# Patient Record
Sex: Female | Born: 1980 | Race: Black or African American | Hispanic: No | Marital: Single | Smoking: Never smoker
Health system: Southern US, Community
[De-identification: ages and names within clinical notes are randomized; demographics above are authoritative.]

## PROBLEM LIST (undated history)

## (undated) HISTORY — PX: APPENDECTOMY: SHX54

## (undated) HISTORY — PX: CHOLECYSTECTOMY: SHX55

---

## 2019-06-25 ENCOUNTER — Other Ambulatory Visit: Payer: Self-pay

## 2019-06-25 ENCOUNTER — Emergency Department (HOSPITAL_COMMUNITY)
Admission: EM | Admit: 2019-06-25 | Discharge: 2019-06-26 | Disposition: A | Discharge status: Home / Self Care | Payer: Medicaid Other | Attending: Emergency Medicine | Admitting: Emergency Medicine

## 2019-06-25 ENCOUNTER — Encounter (HOSPITAL_COMMUNITY): Payer: Self-pay | Admitting: Family Medicine

## 2019-06-25 DIAGNOSIS — O26891 Other specified pregnancy related conditions, first trimester: Secondary | ICD-10-CM | POA: Diagnosis present

## 2019-06-25 DIAGNOSIS — Z3A01 Less than 8 weeks gestation of pregnancy: Secondary | ICD-10-CM | POA: Insufficient documentation

## 2019-06-25 DIAGNOSIS — O2341 Unspecified infection of urinary tract in pregnancy, first trimester: Secondary | ICD-10-CM | POA: Diagnosis not present

## 2019-06-25 DIAGNOSIS — Z79899 Other long term (current) drug therapy: Secondary | ICD-10-CM | POA: Insufficient documentation

## 2019-06-25 LAB — CBC WITH DIFFERENTIAL/PLATELET
Abs Immature Granulocytes: 0.02 10*3/uL (ref 0.00–0.07)
Basophils Absolute: 0 10*3/uL (ref 0.0–0.1)
Basophils Relative: 0 %
Eosinophils Absolute: 0.1 10*3/uL (ref 0.0–0.5)
Eosinophils Relative: 1 %
HCT: 40.4 % (ref 36.0–46.0)
Hemoglobin: 13.6 g/dL (ref 12.0–15.0)
Immature Granulocytes: 0 %
Lymphocytes Relative: 35 %
Lymphs Abs: 3.2 10*3/uL (ref 0.7–4.0)
MCH: 32.6 pg (ref 26.0–34.0)
MCHC: 33.7 g/dL (ref 30.0–36.0)
MCV: 96.9 fL (ref 80.0–100.0)
Monocytes Absolute: 0.6 10*3/uL (ref 0.1–1.0)
Monocytes Relative: 7 %
Neutro Abs: 5.3 10*3/uL (ref 1.7–7.7)
Neutrophils Relative %: 57 %
Platelets: 250 10*3/uL (ref 150–400)
RBC: 4.17 MIL/uL (ref 3.87–5.11)
RDW: 12.1 % (ref 11.5–15.5)
WBC: 9.3 10*3/uL (ref 4.0–10.5)
nRBC: 0 % (ref 0.0–0.2)

## 2019-06-25 LAB — URINALYSIS, ROUTINE W REFLEX MICROSCOPIC
Bacteria, UA: NONE SEEN
Bilirubin Urine: NEGATIVE
Glucose, UA: NEGATIVE mg/dL
Hgb urine dipstick: NEGATIVE
Ketones, ur: 5 mg/dL — AB
Nitrite: NEGATIVE
Protein, ur: NEGATIVE mg/dL
Specific Gravity, Urine: 1.021 (ref 1.005–1.030)
pH: 5 (ref 5.0–8.0)

## 2019-06-25 LAB — BASIC METABOLIC PANEL
Anion gap: 9 (ref 5–15)
BUN: 11 mg/dL (ref 6–20)
CO2: 24 mmol/L (ref 22–32)
Calcium: 9.5 mg/dL (ref 8.9–10.3)
Chloride: 102 mmol/L (ref 98–111)
Creatinine, Ser: 0.82 mg/dL (ref 0.44–1.00)
GFR calc Af Amer: >60 mL/min (ref >60)
GFR calc non Af Amer: >60 mL/min (ref >60)
Glucose, Bld: 97 mg/dL (ref 70–99)
Potassium: 3.3 mmol/L — ABNORMAL LOW (ref 3.5–5.1)
Sodium: 135 mmol/L (ref 135–145)

## 2019-06-25 LAB — PREGNANCY, URINE: Preg Test, Ur: POSITIVE — AB

## 2019-06-25 NOTE — ED Triage Notes (Signed)
Patient is complaining of nausea, loss of appetite, body fatigue, shoulder/neck pain, and dizzy. Symptoms started 2 days ago. Denies taking any medications or therapy for symptoms. Patient is ambulatory to triage room.

## 2019-06-25 NOTE — ED Provider Notes (Signed)
[facility]-EMERGENCY DEPT Provider Note   CSN: [id number] Arrival date & time: 06/25/19  2026     History   Chief Complaint Chief Complaint  Patient presents with   Multiple Complaints    HPI Samantha Leon is a 38 y.o. female.     38 y/o [id number] female presenting for multiple complaints. Notes onset of nausea and anorexia 2 days ago. This has been fairly constant and unchanged. She has also been experiencing generalized fatigue. Reports sporadic discomfort in her lower abdomen with "spotting". LMP was 2.5 weeks ago. Denies use of any OTC medications for symptom relief. Denies sick contacts, fever, vomiting, diarrhea, melena, hematochezia, dysuria, cough, congestion, rhinorrhea.  Alludes to the fact that she was hospitalized 2 years ago for an infection of her Mirena IUD.  States that she had similar symptoms during this admission and also reports that her "thyroid was off".  Was not subsequently put on any thyroid medication.  She is no longer on any form of birth control.  Recently relocated to [facility] from [place], [place].  Does not have a local PCP.     History reviewed. No pertinent past medical history.  There are no active problems to display for this patient.   History reviewed. No pertinent surgical history.   OB History   No obstetric history on file.      Home Medications    Prior to Admission medications   Medication Sig Start Date End Date Taking? Authorizing Provider  calcium carbonate (OS-CAL - DOSED IN MG OF ELEMENTAL CALCIUM) 1250 (500 Ca) MG tablet Take 1 tablet by mouth daily.   Yes [provider]  magnesium 30 MG tablet Take 30 mg by mouth daily.   Yes [provider]  Methylsulfonylmethane (MSM PO) Take 1 tablet by mouth daily.   Yes [provider]  cephALEXin (KEFLEX) 500 MG capsule Take 1 capsule (500 mg total) by mouth 2 (two) times daily. 06/26/19   Antony Madura, PA-C    Family History History  reviewed. No pertinent family history.  Social History Social History   Tobacco Use   Smoking status: Never Smoker   Smokeless tobacco: Never Used  Substance Use Topics   Alcohol use: Yes    Comment: 1 times a week    Drug use: Never     Allergies   Strawberry (diagnostic), Tomato, and Codeine   Review of Systems Review of Systems Ten systems reviewed and are negative for acute change, except as noted in the HPI.    Physical Exam Updated Vital Signs BP 106/71    Pulse 66    Temp 98.3 F (36.8 C) (Oral)    Resp 16    Ht 5\' 6"  (1.676 m)    Wt 83.5 kg    SpO2 100%    BMI 29.70 kg/m   Physical Exam Vitals signs and nursing note reviewed.  Constitutional:      General: She is not in acute distress.    Appearance: She is well-developed. She is not diaphoretic.     Comments: Patient in NAD. Nontoxic appearing  HENT:     Head: Normocephalic and atraumatic.  Eyes:     General: No scleral icterus.    Conjunctiva/sclera: Conjunctivae normal.  Neck:     Musculoskeletal: Normal range of motion.  Cardiovascular:     Rate and Rhythm: Normal rate and regular rhythm.     Pulses: Normal pulses.  Pulmonary:     Effort: Pulmonary effort is  normal. No respiratory distress.     Comments: Respirations even and unlabored. Lungs CTAB. Abdominal:     General: There is no distension.     Comments: Abdomen soft with suprapubic TTP. Also mildly tender in bilateral lower quadrants. No peritoneal signs or palpable masses.  Musculoskeletal: Normal range of motion.  Skin:    General: Skin is warm and dry.     Coloration: Skin is not pale.     Findings: No erythema or rash.  Neurological:     General: No focal deficit present.     Mental Status: She is alert and oriented to person, place, and time.     Coordination: Coordination normal.     Comments: Ambulatory with steady gait.  Psychiatric:        Behavior: Behavior normal.      ED Treatments / Results  Labs (all labs  ordered are listed, but only abnormal results are displayed) Labs Reviewed  URINALYSIS, ROUTINE W REFLEX MICROSCOPIC - Abnormal; Notable for the following components:      Result Value   APPearance HAZY (*)    Ketones, ur 5 (*)    Leukocytes,Ua SMALL (*)    All other components within normal limits  PREGNANCY, URINE - Abnormal; Notable for the following components:   Preg Test, Ur POSITIVE (*)    All other components within normal limits  BASIC METABOLIC PANEL - Abnormal; Notable for the following components:   Potassium 3.3 (*)    All other components within normal limits  HCG, QUANTITATIVE, PREGNANCY - Abnormal; Notable for the following components:   hCG, Beta Chain, Quant, S 42,527 (*)    All other components within normal limits  URINE CULTURE  CBC WITH DIFFERENTIAL/PLATELET  TSH    EKG None  Radiology [place] Ob Comp Less 14 Wks  Result Date: 06/26/2019 CLINICAL DATA:  Initial evaluation for acute pelvic pain, early pregnancy. EXAM: OBSTETRIC <14 WK US AND TRANSVAGINAL OB US TECHNIQUE: Both transabdominal and transvaginal ultrasound examinations were performed for complete evaluation of the gestation as well as the maternal uterus, adnexal regions, and pelvic cul-de-sac. Transvaginal technique was performed to assess early pregnancy. COMPARISON:  None. FINDINGS: Intrauterine gestational sac: Single Yolk sac:  Present Embryo:  Present Cardiac Activity: Cardiac activity is visualized on cine imaging, although not able to be detected via M-mode due to small size. Heart Rate: N/A  bpm CRL: 3.5 mm   6 w   0 d                  US EDC: 03/14/2020 Subchorionic hemorrhage:  None visualized. Maternal uterus/adnexae: Ovaries are normal in appearance bilaterally. Small corpus luteal cyst noted on the right. No free fluid within the pelvis. IMPRESSION: 1. Single IUP with internal yolk sac and embryo, estimated gestational age [redacted] weeks and 0 days by crown-rump length, with ultrasound EDC of 03/14/2020.  Cardiac activity visualized on grayscale cine imaging, although not able to be measured due to small size of the embryo. No complication. 2. No other acute maternal uterine or adnexal abnormality identified. Electronically Signed   By: Rise MuBenjamin  McClintock M.D.   On: 06/26/2019 00:55   [place] Ob Transvaginal  Result Date: 06/26/2019 CLINICAL DATA:  Initial evaluation for acute pelvic pain, early pregnancy. EXAM: OBSTETRIC <14 WK US AND TRANSVAGINAL OB US TECHNIQUE: Both transabdominal and transvaginal ultrasound examinations were performed for complete evaluation of the gestation as well as the maternal uterus, adnexal regions, and pelvic cul-de-sac. Transvaginal technique was  performed to assess early pregnancy. COMPARISON:  None. FINDINGS: Intrauterine gestational sac: Single Yolk sac:  Present Embryo:  Present Cardiac Activity: Cardiac activity is visualized on cine imaging, although not able to be detected via M-mode due to small size. Heart Rate: N/A  bpm CRL: 3.5 mm   6 w   0 d                  [place] EDC: 03/14/2020 Subchorionic hemorrhage:  None visualized. Maternal uterus/adnexae: Ovaries are normal in appearance bilaterally. Small corpus luteal cyst noted on the right. No free fluid within the pelvis. IMPRESSION: 1. Single IUP with internal yolk sac and embryo, estimated gestational age [redacted] weeks and 0 days by crown-rump length, with ultrasound EDC of 03/14/2020. Cardiac activity visualized on grayscale cine imaging, although not able to be measured due to small size of the embryo. No complication. 2. No other acute maternal uterine or adnexal abnormality identified. Electronically Signed   By: Jeannine Boga M.D.   On: 06/26/2019 00:55    Procedures Procedures (including critical care time)  Medications Ordered in ED Medications - No data to display   11:56 PM Patient updated on positive pregnancy test. UA c/w likely UTI. Will add culture. Given lower abdominal pain will obtain ultrasound to  assess for ectopic, though pain may be attributed to UTI.   Initial Impression / Assessment and Plan / ED Course  I have reviewed the triage vital signs and the nursing notes.  Pertinent labs & imaging results that were available during my care of the patient were reviewed by me and considered in my medical decision making (see chart for details).        38 year old [id number] female presenting for lower abdominal pain with associated nausea and anorexia.  She has also been increasingly fatigued.  Her work-up today is significant for an intrauterine pregnancy of approximately 6 weeks.  This is complicated by pyuria consistent with UTI in first trimester of pregnancy.  Urine culture in process.  Remainder of labs today are reassuring.  We will plan for discharge on Keflex.  The patient has been given referral to [facility] for ongoing prenatal care.  Return precautions discussed and provided. Patient discharged in stable condition with no unaddressed concerns.   Final Clinical Impressions(s) / ED Diagnoses   Final diagnoses:  Urinary tract infection in mother during first trimester of pregnancy    ED Discharge Orders         Ordered    cephALEXin (KEFLEX) 500 MG capsule  2 times daily     06/26/19 0103           Antonietta Breach, PA-C 06/26/19 0459    Maudie Flakes, MD 06/27/19 [phone]

## 2019-06-25 NOTE — ED Notes (Signed)
Called down to lab to add on urine culture and hCG quantitative. Spoke with Ubaldo Glassing who advised she has added these on.

## 2019-06-26 ENCOUNTER — Emergency Department (HOSPITAL_COMMUNITY): Payer: Medicaid Other

## 2019-06-26 LAB — TSH: TSH: 0.967 u[IU]/mL (ref 0.350–4.500)

## 2019-06-26 LAB — HCG, QUANTITATIVE, PREGNANCY: hCG, Beta Chain, Quant, S: 42527 m[IU]/mL — ABNORMAL HIGH (ref <5)

## 2019-06-26 MED ORDER — CEPHALEXIN 500 MG PO CAPS: 500.0000 mg | ORAL_CAPSULE | Freq: Two times a day (BID) | ORAL | 0 refills | Status: AC

## 2019-06-26 NOTE — Discharge Instructions (Signed)
Take Keflex as prescribed for management of UTI.  We recommend follow-up with an OB/GYN for ongoing prenatal care and to ensure resolution of this urinary tract infection.  Use Unisom and B6 for management of nausea in pregnancy. Take a daily prenatal vitamin.  You may present to [facility] for any new or concerning symptoms.

## 2019-06-27 LAB — URINE CULTURE

## 2019-08-06 ENCOUNTER — Other Ambulatory Visit: Payer: Self-pay

## 2019-08-06 DIAGNOSIS — Z20822 Contact with and (suspected) exposure to covid-19: Secondary | ICD-10-CM

## 2019-08-07 LAB — NOVEL CORONAVIRUS, NAA: SARS-CoV-2, NAA: NOT DETECTED

## 2019-12-02 ENCOUNTER — Encounter: Payer: Self-pay | Admitting: Obstetrics and Gynecology

## 2019-12-02 ENCOUNTER — Other Ambulatory Visit (HOSPITAL_COMMUNITY)
Admission: RE | Admit: 2019-12-02 | Discharge: 2019-12-02 | Disposition: A | Discharge status: Home / Self Care | Payer: Medicaid Other | Source: Ambulatory Visit | Attending: Obstetrics and Gynecology | Admitting: Obstetrics and Gynecology

## 2019-12-02 ENCOUNTER — Ambulatory Visit (INDEPENDENT_AMBULATORY_CARE_PROVIDER_SITE_OTHER): Payer: Medicaid Other | Admitting: Obstetrics and Gynecology

## 2019-12-02 ENCOUNTER — Other Ambulatory Visit: Payer: Self-pay

## 2019-12-02 VITALS — BP 131/82 | HR 76 | Temp 98.7°F | Ht 66.0 in | Wt 190.7 lb

## 2019-12-02 DIAGNOSIS — Z01419 Encounter for gynecological examination (general) (routine) without abnormal findings: Secondary | ICD-10-CM | POA: Diagnosis not present

## 2019-12-02 DIAGNOSIS — Z0000 Encounter for general adult medical examination without abnormal findings: Secondary | ICD-10-CM

## 2019-12-02 MED ORDER — NORELGESTROMIN-ETH ESTRADIOL 150-35 MCG/24HR TD PTWK: 1.0000 | MEDICATED_PATCH | TRANSDERMAL | 12 refills | Status: AC

## 2019-12-02 NOTE — Progress Notes (Signed)
Subjective:     Samantha Leon is a 39 y.o. female P3 with BMI 30 who is here for a comprehensive physical exam. The patient reports no problems. She is sexually active using condoms and desires to start patch. She reports the presence of a white discharge with odor. She denies any pelvic pain. She reports a monthly period  History reviewed. No pertinent past medical history. Past Surgical History:  Procedure Laterality Date  . APPENDECTOMY    . CHOLECYSTECTOMY     History reviewed. No pertinent family history.   Social History   Socioeconomic History  . Marital status: Single    Spouse name: Not on file  . Number of children: Not on file  . Years of education: Not on file  . Highest education level: Not on file  Occupational History  . Not on file  Tobacco Use  . Smoking status: Never Smoker  . Smokeless tobacco: Never Used  Substance and Sexual Activity  . Alcohol use: Not Currently    Comment: 1 times a week   . Drug use: Never  . Sexual activity: Yes    Birth control/protection: None    Comment: Mirena-pelvic infection, pills-caused gallbladder removal; depo-cyst on ovaries  Other Topics Concern  . Not on file  Social History Narrative  . Not on file   Social Determinants of Health   Financial Resource Strain:   . Difficulty of Paying Living Expenses: Not on file  Food Insecurity:   . Worried About Charity fundraiser in the Last Year: Not on file  . Ran Out of Food in the Last Year: Not on file  Transportation Needs:   . Lack of Transportation (Medical): Not on file  . Lack of Transportation (Non-Medical): Not on file  Physical Activity:   . Days of Exercise per Week: Not on file  . Minutes of Exercise per Session: Not on file  Stress:   . Feeling of Stress : Not on file  Social Connections:   . Frequency of Communication with Friends and Family: Not on file  . Frequency of Social Gatherings with Friends and Family: Not on file  . Attends Religious Services:  Not on file  . Active Member of Clubs or Organizations: Not on file  . Attends Archivist Meetings: Not on file  . Marital Status: Not on file  Intimate Partner Violence:   . Fear of Current or Ex-Partner: Not on file  . Emotionally Abused: Not on file  . Physically Abused: Not on file  . Sexually Abused: Not on file   Health Maintenance  Topic Date Due  . HIV Screening  08/17/1997  . TETANUS/TDAP  08/17/2001  . PAP SMEAR-Modifier  08/18/2003  . INFLUENZA VACCINE  05/25/2019       Review of Systems Pertinent items are noted in HPI.   Objective:  Blood pressure 131/82, pulse 76, temperature 98.7 F (37.1 C), temperature source Oral, height 5\' 6"  (1.676 m), weight 190 lb 11.2 oz (86.5 kg).     GENERAL: Well-developed, well-nourished female in no acute distress.  HEENT: Normocephalic, atraumatic. Sclerae anicteric.  NECK: Supple. Normal thyroid.  LUNGS: Clear to auscultation bilaterally.  HEART: Regular rate and rhythm. BREASTS: Symmetric in size. No palpable masses or lymphadenopathy, skin changes, or nipple drainage. ABDOMEN: Soft, nontender, nondistended. No organomegaly. PELVIC: Normal external female genitalia. Vagina is pink and rugated.  Normal discharge. Normal appearing cervix. Uterus is normal in size.  No adnexal mass or tenderness. EXTREMITIES: No  cyanosis, clubbing, or edema, 2+ distal pulses.    Assessment:    Healthy female exam.      Plan:    pap smear collected STI screening per patient reguest Rx ortho evra provided Patient will be contacted with abnormal results See After Visit Summary for Counseling Recommendations

## 2019-12-03 LAB — HEPATITIS B SURFACE ANTIGEN: Hepatitis B Surface Ag: NEGATIVE

## 2019-12-03 LAB — CYTOLOGY - PAP
Comment: NEGATIVE
Diagnosis: NEGATIVE
High risk HPV: NEGATIVE

## 2019-12-03 LAB — CERVICOVAGINAL ANCILLARY ONLY
Bacterial Vaginitis (gardnerella): POSITIVE — AB
Candida Glabrata: NEGATIVE
Candida Vaginitis: NEGATIVE
Chlamydia: NEGATIVE
Comment: NEGATIVE
Comment: NEGATIVE
Comment: NEGATIVE
Comment: NEGATIVE
Comment: NEGATIVE
Comment: NORMAL
Neisseria Gonorrhea: NEGATIVE
Trichomonas: POSITIVE — AB

## 2019-12-03 LAB — HEPATITIS C ANTIBODY: Hep C Virus Ab: <0.1 s/co ratio (ref 0.0–0.9)

## 2019-12-03 LAB — TSH: TSH: 0.854 u[IU]/mL (ref 0.450–4.500)

## 2019-12-03 MED ORDER — METRONIDAZOLE 500 MG PO TABS: 500.0000 mg | ORAL_TABLET | Freq: Two times a day (BID) | ORAL | 0 refills | Status: DC

## 2019-12-03 NOTE — Addendum Note (Signed)
Addended by: Catalina Antigua on: 12/03/2019 02:38 PM   Modules accepted: Orders

## 2019-12-30 ENCOUNTER — Other Ambulatory Visit: Payer: Self-pay

## 2020-01-02 ENCOUNTER — Encounter: Payer: Self-pay | Admitting: Obstetrics and Gynecology

## 2020-01-02 ENCOUNTER — Ambulatory Visit: Payer: Medicaid Other | Admitting: Obstetrics and Gynecology

## 2020-01-02 ENCOUNTER — Other Ambulatory Visit: Payer: Self-pay

## 2020-01-02 ENCOUNTER — Other Ambulatory Visit (HOSPITAL_COMMUNITY)
Admission: RE | Admit: 2020-01-02 | Discharge: 2020-01-02 | Disposition: A | Discharge status: Home / Self Care | Payer: Medicaid Other | Source: Ambulatory Visit | Attending: Obstetrics and Gynecology | Admitting: Obstetrics and Gynecology

## 2020-01-02 VITALS — BP 132/84 | Wt 188.0 lb

## 2020-01-02 DIAGNOSIS — Z113 Encounter for screening for infections with a predominantly sexual mode of transmission: Secondary | ICD-10-CM

## 2020-01-02 NOTE — Progress Notes (Signed)
Pt tested positive for trichomonas on 12/02/19, treated 12/03/19. Pt reports that her partner just told her that he was never treated, and pt reports she was intimate with him again around 12/14/19. Pt would like to know if she needs to be tested again.

## 2020-01-02 NOTE — Progress Notes (Signed)
39 yo here for STI testing. She was recently treated for trichomonas but partner was never treated. They have since been intimate again without condoms. Patient denies any pelvic pain or abnormal discharge.  No past medical history on file. Past Surgical History:  Procedure Laterality Date  . APPENDECTOMY    . CHOLECYSTECTOMY     No family history on file. Social History   Tobacco Use  . Smoking status: Never Smoker  . Smokeless tobacco: Never Used  Substance Use Topics  . Alcohol use: Not Currently    Comment: 1 times a week   . Drug use: Never   ROS See pertinent in HPI. All other systems reviewed and negative Blood pressure 132/84, weight 188 lb (85.3 kg), last menstrual period 01/01/2020.  GENERAL: Well-developed, well-nourished female in no acute distress.  ABDOMEN: Soft, nontender, nondistended. No organomegaly. PELVIC: Normal external female genitalia. Vagina is pink and rugated.  Normal discharge.  EXTREMITIES: No cyanosis, clubbing, or edema, 2+ distal pulses.  A/P 39 yo here for STI testing  - vaginal swab collected - patient will be contacted with abnormal results - RTC prn

## 2020-01-02 NOTE — Patient Instructions (Signed)
Trichomoniasis Trichomoniasis is an STI (sexually transmitted infection) that can affect both women and men. In women, the outer area of the female genitalia (vulva) and the vagina are affected. In men, mainly the penis is affected, but the prostate and other reproductive organs can also be involved.  This condition can be treated with medicine. It often has no symptoms (is asymptomatic), especially in men. If not treated, trichomoniasis can last for months or years. What are the causes? This condition is caused by a parasite called Trichomonas vaginalis. Trichomoniasis most often spreads from person to person (is contagious) through sexual contact. What increases the risk? The following factors may make you more likely to develop this condition:  Having unprotected sex.  Having sex with a partner who has trichomoniasis.  Having multiple sexual partners.  Having had previous trichomoniasis infections or other STIs. What are the signs or symptoms? In women, symptoms of trichomoniasis include:  Abnormal vaginal discharge that is clear, white, gray, or yellow-green and foamy and has an unusual "fishy" odor.  Itching and irritation of the vagina and vulva.  Burning or pain during urination or sex.  Redness and swelling of the genitals. In men, symptoms of trichomoniasis include:  Penile discharge that may be foamy or contain pus.  Pain in the penis. This may happen only when urinating.  Itching or irritation inside the penis.  Burning after urination or ejaculation. How is this diagnosed? In women, this condition may be found during a routine Pap test or physical exam. It may be found in men during a routine physical exam. Your health care provider may do tests to help diagnose this infection, such as:  Urine tests (men and women).  The following in women: ? Testing the pH of the vagina. ? A vaginal swab test that checks for the Trichomonas vaginalis parasite. ? Testing vaginal  secretions. Your health care provider may test you for other STIs, including HIV (human immunodeficiency virus). How is this treated? This condition is treated with medicine taken by mouth (orally), such as metronidazole or tinidazole, to fight the infection. Your sexual partner(s) also need to be tested and treated.  If you are a woman and you plan to become pregnant or think you may be pregnant, tell your health care provider right away. Some medicines that are used to treat the infection should not be taken during pregnancy. Your health care provider may recommend over-the-counter medicines or creams to help relieve itching or irritation. You may be tested for infection again 3 months after treatment. Follow these instructions at home:  Take and use over-the-counter and prescription medicines, including creams, only as told by your health care provider.  Take your antibiotic medicine as told by your health care provider. Do not stop taking the antibiotic even if you start to feel better.  Do not have sex until 7-10 days after you finish your medicine, or until your health care provider approves. Ask your health care provider when you may start to have sex again.  (Women) Do not douche or wear tampons while you have the infection.  Discuss your infection with your sexual partner(s). Make sure that your partner gets tested and treated, if necessary.  Keep all follow-up visits as told by your health care provider. This is important. How is this prevented?   Use condoms every time you have sex. Using condoms correctly and consistently can help protect against STIs.  Avoid having multiple sexual partners.  Talk with your sexual partner about any   symptoms that either of you may have, as well as any history of STIs.  Get tested for STIs and STDs (sexually transmitted diseases) before you have sex. Ask your partner to do the same.  Do not have sexual contact if you have symptoms of  trichomoniasis or another STI. Contact a health care provider if:  You still have symptoms after you finish your medicine.  You develop pain in your abdomen.  You have pain when you urinate.  You have bleeding after sex.  You develop a rash.  You feel nauseous or you vomit.  You plan to become pregnant or think you may be pregnant. Summary  Trichomoniasis is an STI (sexually transmitted infection) that can affect both women and men.  This condition often has no symptoms (is asymptomatic), especially in men.  Without treatment, this condition can last for months or years.  You should not have sex until 7-10 days after you finish your medicine, or until your health care provider approves. Ask your health care provider when you may start to have sex again.  Discuss your infection with your sexual partner(s). Make sure that your partner gets tested and treated, if necessary. This information is not intended to replace advice given to you by your health care provider. Make sure you discuss any questions you have with your health care provider. Document Revised: 07/24/2018 Document Reviewed: 07/24/2018 Elsevier Patient Education  2020 [employer]  

## 2020-01-03 LAB — CERVICOVAGINAL ANCILLARY ONLY
Chlamydia: NEGATIVE
Comment: NEGATIVE
Comment: NEGATIVE
Comment: NORMAL
Neisseria Gonorrhea: NEGATIVE
Trichomonas: NEGATIVE

## 2020-01-30 ENCOUNTER — Other Ambulatory Visit: Payer: Self-pay | Admitting: Obstetrics and Gynecology

## 2020-01-30 MED ORDER — XULANE 150-35 MCG/24HR TD PTWK: 1.0000 | MEDICATED_PATCH | TRANSDERMAL | 12 refills | Status: AC

## 2020-02-22 ENCOUNTER — Encounter (HOSPITAL_COMMUNITY): Payer: Self-pay | Admitting: Emergency Medicine

## 2020-02-22 ENCOUNTER — Emergency Department (HOSPITAL_COMMUNITY)
Admission: EM | Admit: 2020-02-22 | Discharge: 2020-02-22 | Disposition: A | Discharge status: Home / Self Care | Payer: Medicaid Other | Attending: Emergency Medicine | Admitting: Emergency Medicine

## 2020-02-22 ENCOUNTER — Other Ambulatory Visit: Payer: Self-pay

## 2020-02-22 DIAGNOSIS — N760 Acute vaginitis: Secondary | ICD-10-CM | POA: Diagnosis not present

## 2020-02-22 DIAGNOSIS — B373 Candidiasis of vulva and vagina: Secondary | ICD-10-CM | POA: Diagnosis not present

## 2020-02-22 DIAGNOSIS — B3731 Acute candidiasis of vulva and vagina: Secondary | ICD-10-CM

## 2020-02-22 DIAGNOSIS — Z79899 Other long term (current) drug therapy: Secondary | ICD-10-CM | POA: Diagnosis not present

## 2020-02-22 DIAGNOSIS — B9689 Other specified bacterial agents as the cause of diseases classified elsewhere: Secondary | ICD-10-CM

## 2020-02-22 DIAGNOSIS — N898 Other specified noninflammatory disorders of vagina: Secondary | ICD-10-CM | POA: Diagnosis present

## 2020-02-22 LAB — CBC WITH DIFFERENTIAL/PLATELET
Abs Immature Granulocytes: 0 10*3/uL (ref 0.00–0.07)
Basophils Absolute: 0 10*3/uL (ref 0.0–0.1)
Basophils Relative: 0 %
Eosinophils Absolute: 0.1 10*3/uL (ref 0.0–0.5)
Eosinophils Relative: 1 %
HCT: 42.5 % (ref 36.0–46.0)
Hemoglobin: 14.5 g/dL (ref 12.0–15.0)
Immature Granulocytes: 0 %
Lymphocytes Relative: 31 %
Lymphs Abs: 2.1 10*3/uL (ref 0.7–4.0)
MCH: 33 pg (ref 26.0–34.0)
MCHC: 34.1 g/dL (ref 30.0–36.0)
MCV: 96.6 fL (ref 80.0–100.0)
Monocytes Absolute: 0.4 10*3/uL (ref 0.1–1.0)
Monocytes Relative: 7 %
Neutro Abs: 4 10*3/uL (ref 1.7–7.7)
Neutrophils Relative %: 61 %
Platelets: 238 10*3/uL (ref 150–400)
RBC: 4.4 MIL/uL (ref 3.87–5.11)
RDW: 11.8 % (ref 11.5–15.5)
WBC: 6.6 10*3/uL (ref 4.0–10.5)
nRBC: 0 % (ref 0.0–0.2)

## 2020-02-22 LAB — URINALYSIS, ROUTINE W REFLEX MICROSCOPIC
Bilirubin Urine: NEGATIVE
Glucose, UA: NEGATIVE mg/dL
Hgb urine dipstick: NEGATIVE
Ketones, ur: NEGATIVE mg/dL
Leukocytes,Ua: NEGATIVE
Nitrite: NEGATIVE
Protein, ur: NEGATIVE mg/dL
Specific Gravity, Urine: 1.021 (ref 1.005–1.030)
pH: 6 (ref 5.0–8.0)

## 2020-02-22 LAB — COMPREHENSIVE METABOLIC PANEL
ALT: 31 U/L (ref 0–44)
AST: 27 U/L (ref 15–41)
Albumin: 3.8 g/dL (ref 3.5–5.0)
Alkaline Phosphatase: 61 U/L (ref 38–126)
Anion gap: 7 (ref 5–15)
BUN: 11 mg/dL (ref 6–20)
CO2: 23 mmol/L (ref 22–32)
Calcium: 9.3 mg/dL (ref 8.9–10.3)
Chloride: 108 mmol/L (ref 98–111)
Creatinine, Ser: 0.7 mg/dL (ref 0.44–1.00)
GFR calc Af Amer: >60 mL/min (ref >60)
GFR calc non Af Amer: >60 mL/min (ref >60)
Glucose, Bld: 100 mg/dL — ABNORMAL HIGH (ref 70–99)
Potassium: 3.6 mmol/L (ref 3.5–5.1)
Sodium: 138 mmol/L (ref 135–145)
Total Bilirubin: 0.7 mg/dL (ref 0.3–1.2)
Total Protein: 7.6 g/dL (ref 6.5–8.1)

## 2020-02-22 LAB — LIPASE, BLOOD: Lipase: 31 U/L (ref 11–51)

## 2020-02-22 LAB — WET PREP, GENITAL
Sperm: NONE SEEN
Trich, Wet Prep: NONE SEEN

## 2020-02-22 MED ORDER — FLUCONAZOLE 150 MG PO TABS
150.0000 mg | ORAL_TABLET | Freq: Once | ORAL | Status: AC
  Administered 2020-02-22: 150 mg via ORAL
  Filled 2020-02-22: qty 1

## 2020-02-22 MED ORDER — SODIUM CHLORIDE 0.9 % IV BOLUS
1000.0000 mL | Freq: Once | INTRAVENOUS | Status: AC
  Administered 2020-02-22: 1000 mL via INTRAVENOUS

## 2020-02-22 MED ORDER — METRONIDAZOLE 500 MG PO TABS
500.0000 mg | ORAL_TABLET | Freq: Once | ORAL | Status: AC
  Administered 2020-02-22: 500 mg via ORAL
  Filled 2020-02-22: qty 1

## 2020-02-22 NOTE — ED Triage Notes (Signed)
Pt having abd pain and back pains for about week. Past 2 days has white-beige vaginal discharge.

## 2020-02-22 NOTE — ED Notes (Signed)
Urine culture sent down to lab with urinalysis. 

## 2020-02-22 NOTE — ED Provider Notes (Signed)
[facility]-EMERGENCY DEPT Provider Note   CSN: [id number] Arrival date & time: 02/22/20  0856     History Chief Complaint  Patient presents with  . Abdominal Pain  . Back Pain  . Vaginal Discharge    Dimitri Dsouza is a 39 y.o. female.  Pt presents to the ED today with abdominal pain and vaginal d/c.  Pt said she's had intermittent pain in her low back and in her low abdomen for about a week.  She's had d/c for about 2 days.  She is sexually active.  No condoms.  She is on a birth control patch.  LMP 4/18.  Pt denies any abnormal vaginal bleeding.  No f/c.  No n/v/d/constipation.  She made an appt with her doctor, but it's at the end of May.  She had a severe LLQ pain this am, so she came to the ED.        History reviewed. No pertinent past medical history.  There are no problems to display for this patient.   Past Surgical History:  Procedure Laterality Date  . APPENDECTOMY    . CHOLECYSTECTOMY       OB History    Gravida  6   Para  3   Term  3   Preterm      AB  2   Living  3     SAB  1   TAB      Ectopic      Multiple      Live Births  3           No family history on file.  Social History   Tobacco Use  . Smoking status: Never Smoker  . Smokeless tobacco: Never Used  Substance Use Topics  . Alcohol use: Not Currently    Comment: 1 times a week   . Drug use: Never    Home Medications Prior to Admission medications   Medication Sig Start Date End Date Taking? Authorizing Provider  Ascorbic Acid (VITAMIN C PO) Take 1 tablet by mouth daily.   Yes [provider]  ASHWAGANDHA PO Take 1 tablet by mouth daily.   Yes [provider]  BIOTIN PO Take 1 tablet by mouth daily.   Yes [provider]  Cyanocobalamin (B-12 PO) Take 1 tablet by mouth daily.   Yes [provider]  Ferrous Sulfate (IRON PO) Take 1 tablet by mouth daily.   Yes [provider]  Methylsulfonylmethane  (MSM PO) Take 1 tablet by mouth daily.   Yes [provider]  Moringa Oleifera (MORINGA PO) Take 1 tablet by mouth daily.   Yes [provider]  Multiple Vitamins-Minerals (ZINC PO) Take 1 tablet by mouth daily.   Yes [provider]  norelgestromin-ethinyl estradiol [facility]) 150-35 MCG/24HR transdermal patch Place 1 patch onto the skin once a week. 01/30/20  Yes Constant, Peggy, MD  Prenatal Vit-Fe Fumarate-FA (PRENATAL PO) Take 1 tablet by mouth daily.   Yes [provider]  SELENIUM PO Take 1 tablet by mouth daily.   Yes [provider]  sodium chloride (OCEAN) 0.65 % SOLN nasal spray Place 1 spray into both nostrils as needed for congestion.   Yes [provider]  cephALEXin (KEFLEX) 500 MG capsule Take 1 capsule (500 mg total) by mouth 2 (two) times daily. Patient not taking: Reported on 12/02/2019 06/26/19   Antony Madura, PA-C  metroNIDAZOLE (FLAGYL) 500 MG tablet Take 1 tablet (500 mg total) by  mouth 2 (two) times daily. Patient not taking: Reported on 01/02/2020 12/03/19   Constant, Peggy, MD  norelgestromin-ethinyl estradiol (ORTHO EVRA) 150-35 MCG/24HR transdermal patch Place 1 patch onto the skin once a week. Patient not taking: Reported on 02/22/2020 12/02/19   Constant, Peggy, MD    Allergies    Strawberry (diagnostic), Tomato, and Codeine  Review of Systems   Review of Systems  Gastrointestinal: Positive for abdominal pain.  Genitourinary: Positive for vaginal discharge.  All other systems reviewed and are negative.   Physical Exam Updated Vital Signs BP 121/81 (BP Location: Left Arm)   Pulse 65   Temp 98.2 F (36.8 C)   Resp 18   LMP 02/09/2020   SpO2 100%   Physical Exam Vitals and nursing note reviewed.  Constitutional:      Appearance: She is well-developed.  HENT:     Head: Normocephalic and atraumatic.     Mouth/Throat:     Mouth: Mucous membranes are moist.  Eyes:     Extraocular Movements: Extraocular movements  intact.     Pupils: Pupils are equal, round, and reactive to light.  Cardiovascular:     Rate and Rhythm: Normal rate and regular rhythm.  Pulmonary:     Effort: Pulmonary effort is normal.     Breath sounds: Normal breath sounds.  Abdominal:     General: Abdomen is flat. Bowel sounds are normal.     Palpations: Abdomen is soft.  Genitourinary:    Vagina: Normal.     Cervix: Discharge present.     Uterus: Normal.      Adnexa: Right adnexa normal and left adnexa normal.  Skin:    General: Skin is warm.     Capillary Refill: Capillary refill takes less than 2 seconds.  Neurological:     General: No focal deficit present.     Mental Status: She is alert and oriented to person, place, and time.  Psychiatric:        Mood and Affect: Mood normal.        Behavior: Behavior normal.     ED Results / Procedures / Treatments   Labs (all labs ordered are listed, but only abnormal results are displayed) Labs Reviewed  WET PREP, GENITAL - Abnormal; Notable for the following components:      Result Value   Yeast Wet Prep HPF POC PRESENT (*)    Clue Cells Wet Prep HPF POC PRESENT (*)    WBC, Wet Prep HPF POC MANY (*)    All other components within normal limits  COMPREHENSIVE METABOLIC PANEL - Abnormal; Notable for the following components:   Glucose, Bld 100 (*)    All other components within normal limits  CBC WITH DIFFERENTIAL/PLATELET  LIPASE, BLOOD  URINALYSIS, ROUTINE W REFLEX MICROSCOPIC  GC/CHLAMYDIA PROBE AMP ([place]) NOT AT [facility]    EKG None  Radiology No results found.  Procedures Procedures (including critical care time)  Medications Ordered in ED Medications  fluconazole (DIFLUCAN) tablet 150 mg (has no administration in time range)  metroNIDAZOLE (FLAGYL) tablet 500 mg (has no administration in time range)  sodium chloride 0.9 % bolus 1,000 mL (1,000 mLs Intravenous New Bag/Given 02/22/20 1610)    ED Course  I have reviewed the triage vital signs and the  nursing notes.  Pertinent labs & imaging results that were available during my care of the patient were reviewed by me and considered in my medical decision making (see chart for details).    MDM  Rules/Calculators/A&P                      Pt does have BV and will be treated with flagyl.  Pt also has candidiasis and will be treated with diflucan.   Pt is stable for d/c.  Return if worse.   Final Clinical Impression(s) / ED Diagnoses Final diagnoses:  BV (bacterial vaginosis)  Vaginal candidiasis    Rx / DC Orders ED Discharge Orders    None       Jacalyn Lefevre, MD 02/22/20 1115

## 2020-02-22 NOTE — ED Notes (Signed)
Patient aware that urine sample is needed.

## 2020-02-23 ENCOUNTER — Telehealth (HOSPITAL_COMMUNITY): Payer: Self-pay | Admitting: Emergency Medicine

## 2020-02-23 MED ORDER — METRONIDAZOLE 500 MG PO TABS: 500.0000 mg | ORAL_TABLET | Freq: Two times a day (BID) | ORAL | 0 refills | Status: AC

## 2020-02-23 NOTE — Telephone Encounter (Signed)
Patient went to pharmacy but no script there.  Looks like she should be on flagyl. Will prescribe.

## 2020-02-24 LAB — GC/CHLAMYDIA PROBE AMP (~~LOC~~) NOT AT ARMC
Chlamydia: NEGATIVE
Comment: NEGATIVE
Comment: NORMAL
Neisseria Gonorrhea: NEGATIVE

## 2020-03-24 ENCOUNTER — Emergency Department (HOSPITAL_COMMUNITY)
Admission: EM | Admit: 2020-03-24 | Discharge: 2020-03-25 | Disposition: A | Discharge status: Home / Self Care | Payer: Medicaid Other | Attending: Emergency Medicine | Admitting: Emergency Medicine

## 2020-03-24 ENCOUNTER — Other Ambulatory Visit: Payer: Self-pay

## 2020-03-24 ENCOUNTER — Encounter (HOSPITAL_COMMUNITY): Payer: Self-pay

## 2020-03-24 DIAGNOSIS — R112 Nausea with vomiting, unspecified: Secondary | ICD-10-CM | POA: Insufficient documentation

## 2020-03-24 DIAGNOSIS — R109 Unspecified abdominal pain: Secondary | ICD-10-CM | POA: Diagnosis not present

## 2020-03-24 DIAGNOSIS — R531 Weakness: Secondary | ICD-10-CM | POA: Insufficient documentation

## 2020-03-24 DIAGNOSIS — Z5321 Procedure and treatment not carried out due to patient leaving prior to being seen by health care provider: Secondary | ICD-10-CM | POA: Insufficient documentation

## 2020-03-24 MED ORDER — SODIUM CHLORIDE 0.9% FLUSH: 3.0000 mL | Freq: Once | INTRAVENOUS | Status: DC

## 2020-03-24 NOTE — ED Triage Notes (Signed)
Patient arrived stating she has been feeling weak with nausea, vomiting, and abdominal pain since Sunday. Also reporting sinus drainage.

## 2020-03-25 LAB — COMPREHENSIVE METABOLIC PANEL
ALT: 27 U/L (ref 0–44)
AST: 32 U/L (ref 15–41)
Albumin: 3.7 g/dL (ref 3.5–5.0)
Alkaline Phosphatase: 61 U/L (ref 38–126)
Anion gap: 9 (ref 5–15)
BUN: 8 mg/dL (ref 6–20)
CO2: 24 mmol/L (ref 22–32)
Calcium: 8.9 mg/dL (ref 8.9–10.3)
Chloride: 102 mmol/L (ref 98–111)
Creatinine, Ser: 0.63 mg/dL (ref 0.44–1.00)
GFR calc Af Amer: >60 mL/min (ref >60)
GFR calc non Af Amer: >60 mL/min (ref >60)
Glucose, Bld: 101 mg/dL — ABNORMAL HIGH (ref 70–99)
Potassium: 4.5 mmol/L (ref 3.5–5.1)
Sodium: 135 mmol/L (ref 135–145)
Total Bilirubin: 0.8 mg/dL (ref 0.3–1.2)
Total Protein: 6.9 g/dL (ref 6.5–8.1)

## 2020-03-25 LAB — CBC
HCT: 36.5 % (ref 36.0–46.0)
Hemoglobin: 12.6 g/dL (ref 12.0–15.0)
MCH: 33.2 pg (ref 26.0–34.0)
MCHC: 34.5 g/dL (ref 30.0–36.0)
MCV: 96.1 fL (ref 80.0–100.0)
Platelets: 252 10*3/uL (ref 150–400)
RBC: 3.8 MIL/uL — ABNORMAL LOW (ref 3.87–5.11)
RDW: 12.5 % (ref 11.5–15.5)
WBC: 9.7 10*3/uL (ref 4.0–10.5)
nRBC: 0 % (ref 0.0–0.2)

## 2020-03-25 LAB — LIPASE, BLOOD: Lipase: 38 U/L (ref 11–51)

## 2020-03-25 NOTE — ED Notes (Signed)
Not seen in lobby for recheck of V/S 

## 2020-04-07 DIAGNOSIS — Z3A09 9 weeks gestation of pregnancy: Secondary | ICD-10-CM | POA: Diagnosis not present

## 2020-04-07 DIAGNOSIS — Z91018 Allergy to other foods: Secondary | ICD-10-CM | POA: Diagnosis not present

## 2020-04-07 DIAGNOSIS — O26891 Other specified pregnancy related conditions, first trimester: Secondary | ICD-10-CM | POA: Diagnosis not present

## 2020-04-07 DIAGNOSIS — O039 Complete or unspecified spontaneous abortion without complication: Secondary | ICD-10-CM | POA: Diagnosis not present

## 2020-04-07 DIAGNOSIS — Z885 Allergy status to narcotic agent status: Secondary | ICD-10-CM | POA: Diagnosis not present

## 2020-04-07 DIAGNOSIS — R1030 Lower abdominal pain, unspecified: Secondary | ICD-10-CM | POA: Diagnosis not present

## 2020-04-08 DIAGNOSIS — Z3A09 9 weeks gestation of pregnancy: Secondary | ICD-10-CM | POA: Diagnosis not present

## 2020-04-08 DIAGNOSIS — O26891 Other specified pregnancy related conditions, first trimester: Secondary | ICD-10-CM | POA: Diagnosis not present

## 2020-04-21 DIAGNOSIS — O219 Vomiting of pregnancy, unspecified: Secondary | ICD-10-CM | POA: Diagnosis not present

## 2020-04-21 DIAGNOSIS — Z3201 Encounter for pregnancy test, result positive: Secondary | ICD-10-CM | POA: Diagnosis not present

## 2020-04-23 DIAGNOSIS — Z3201 Encounter for pregnancy test, result positive: Secondary | ICD-10-CM | POA: Diagnosis not present

## 2020-04-30 DIAGNOSIS — O021 Missed abortion: Secondary | ICD-10-CM | POA: Diagnosis not present

## 2020-05-01 DIAGNOSIS — O021 Missed abortion: Secondary | ICD-10-CM | POA: Diagnosis not present

## 2021-07-08 IMAGING — US US OB COMP LESS 14 WK
1 series · 13 of 28 positions shown · non-contrast
Comparison: None.

CLINICAL DATA: Initial evaluation for acute pelvic pain, early
pregnancy.

EXAM:
OBSTETRIC <14 WK US AND TRANSVAGINAL OB US
TECHNIQUE: Both transabdominal and transvaginal ultrasound examinations were
performed for complete evaluation of the gestation as well as the
maternal uterus, adnexal regions, and pelvic cul-de-sac.
Transvaginal technique was performed to assess early pregnancy.

[Series 1: us ob comp less 14 wk · 13 of 87 slices shown]
[im 4/87]
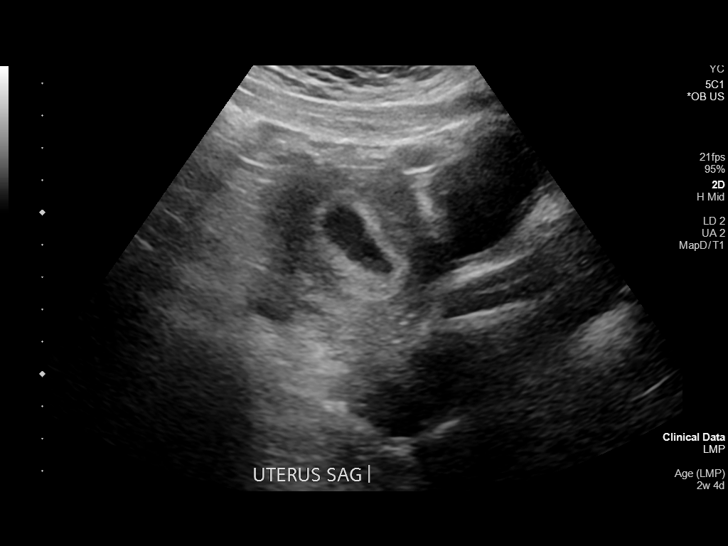
[im 10/87]
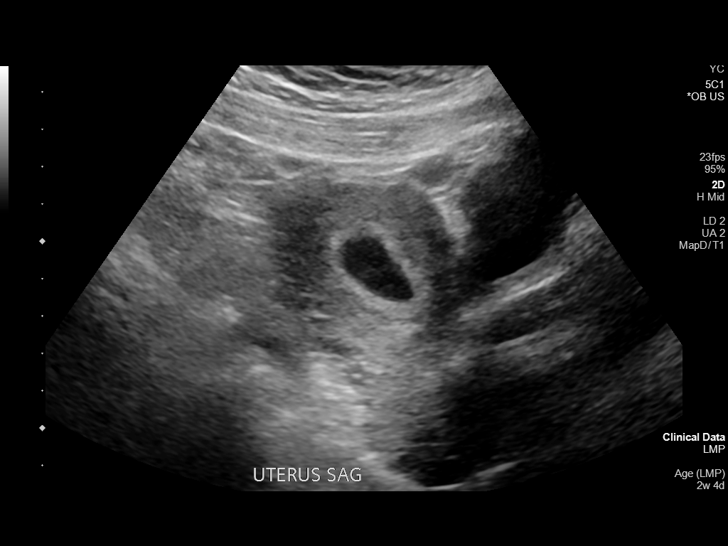
[im 16/87]
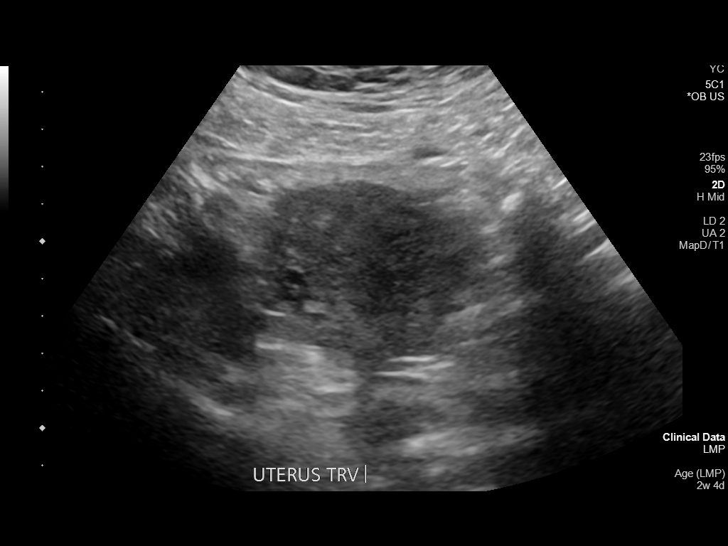
[im 23/87]
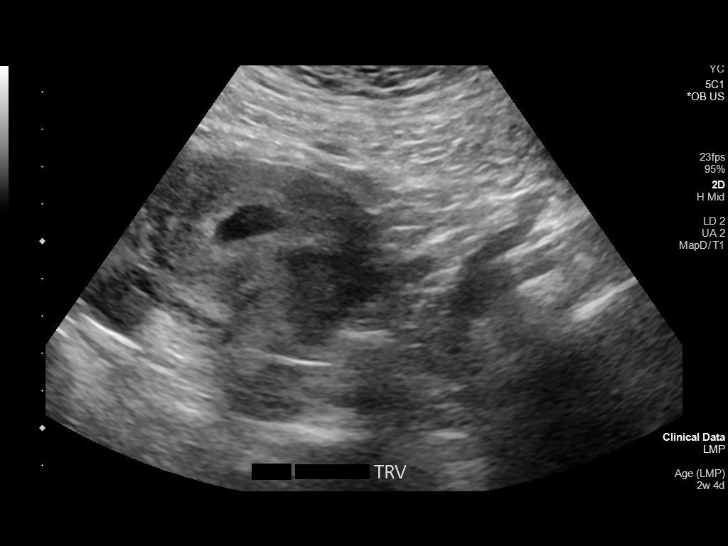
[im 29/87]
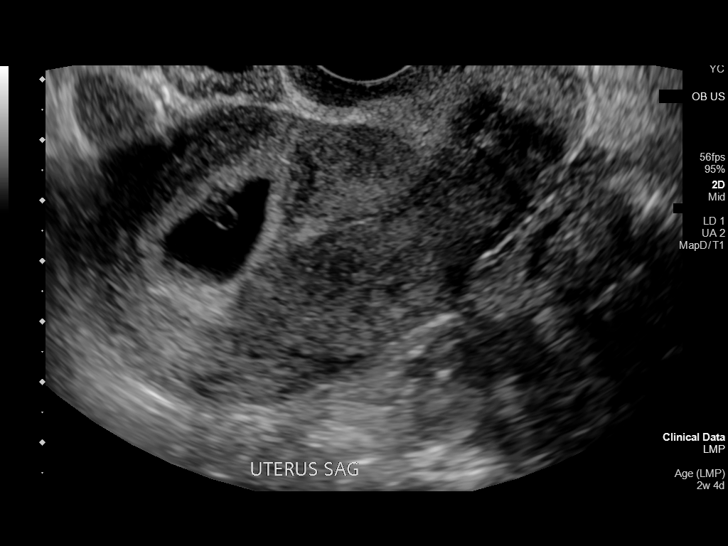
[im 36/87]
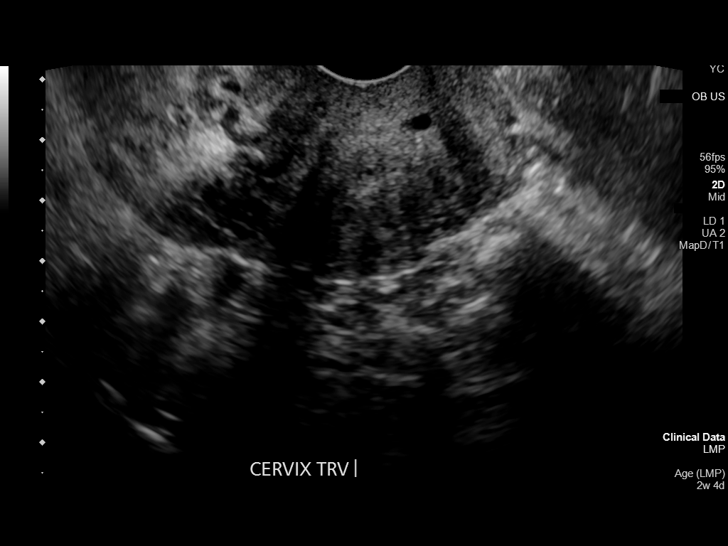
[im 45/87]
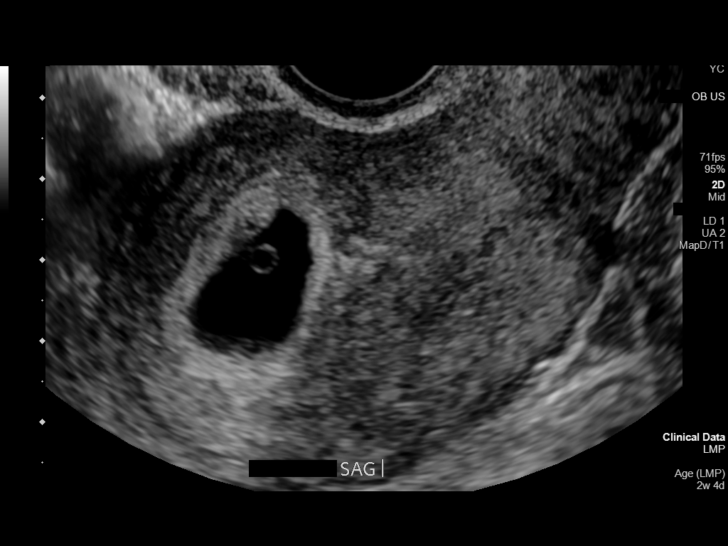
[im 51/87]
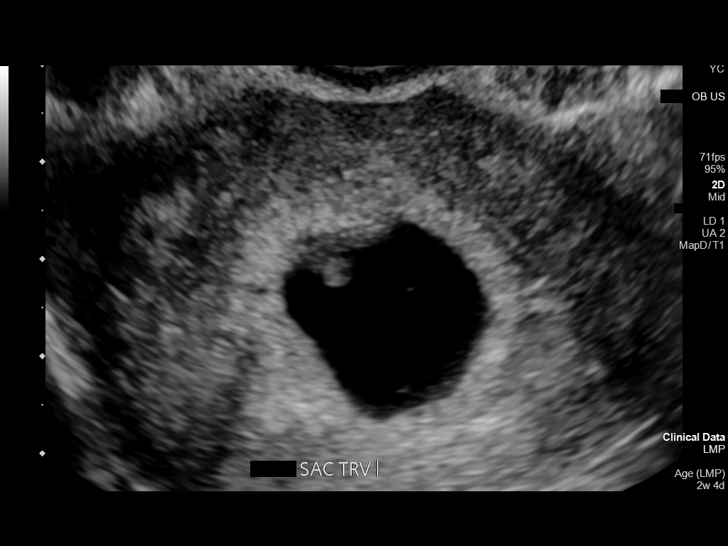
[im 58/87]
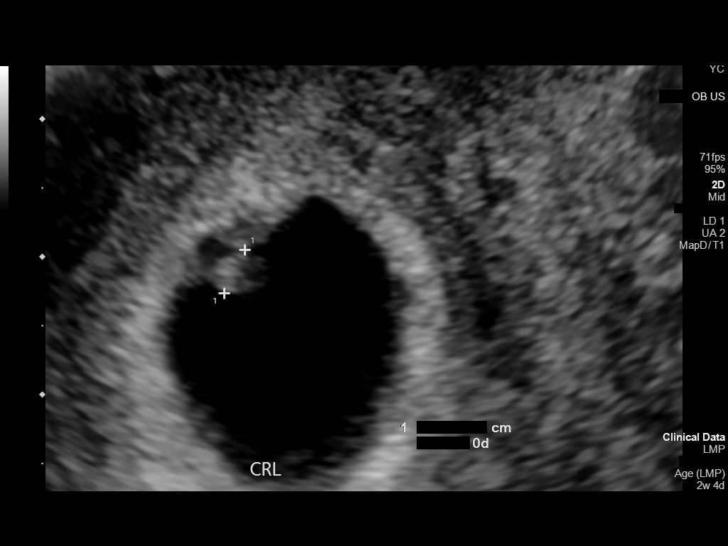
[im 64/87]
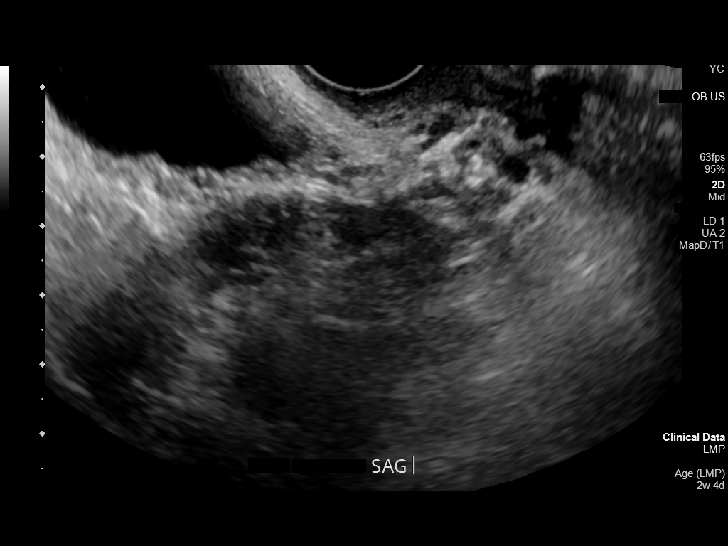
[im 71/87]
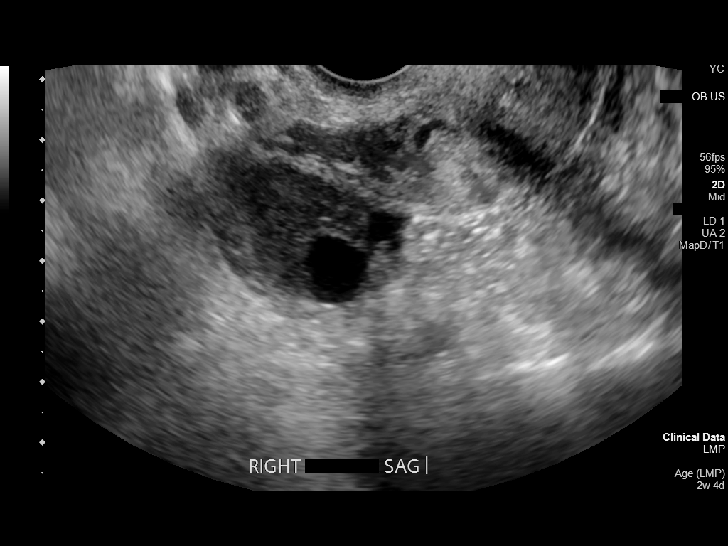
[im 77/87]
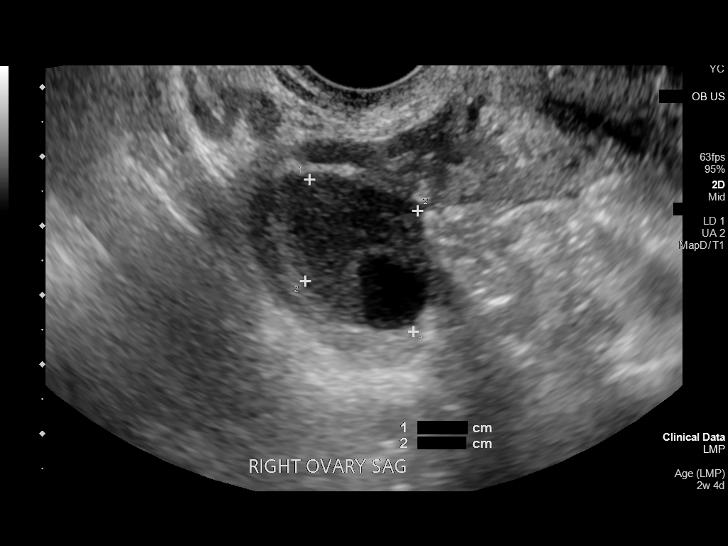
[im 83/87]
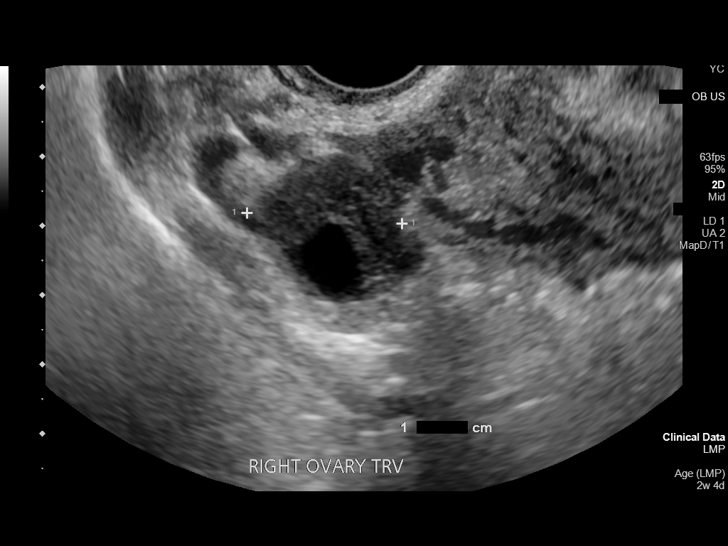

[13 of 28 positions shown; findings below may reference images not displayed]

FINDINGS: Intrauterine gestational sac: Single

Yolk sac:  Present

Embryo:  Present

Cardiac Activity: Cardiac activity is visualized on cine imaging,
although not able to be detected via M-mode due to small size.

Heart Rate: N/A  bpm

CRL: 3.5 mm   6 w   0 d                  US EDC: 03/14/2020

Subchorionic hemorrhage:  None visualized.

Maternal uterus/adnexae: Ovaries are normal in appearance
bilaterally. Small corpus luteal cyst noted on the right. No free
fluid within the pelvis.
IMPRESSION: 1. Single IUP with internal yolk sac and embryo, estimated
gestational age 6 weeks and 0 days by crown-rump length, with
ultrasound EDC of 03/14/2020. Cardiac activity visualized on
grayscale cine imaging, although not able to be measured due to
small size of the embryo. No complication.
2. No other acute maternal uterine or adnexal abnormality
identified.

## 2021-07-08 IMAGING — US US OB TRANSVAGINAL
1 series · 13 of 28 positions shown · non-contrast
Comparison: None.

CLINICAL DATA: Initial evaluation for acute pelvic pain, early
pregnancy.

EXAM:
OBSTETRIC <14 WK US AND TRANSVAGINAL OB US
TECHNIQUE: Both transabdominal and transvaginal ultrasound examinations were
performed for complete evaluation of the gestation as well as the
maternal uterus, adnexal regions, and pelvic cul-de-sac.
Transvaginal technique was performed to assess early pregnancy.

[Series 1: us ob transvaginal · 13 of 87 slices shown]
[im 4/87]
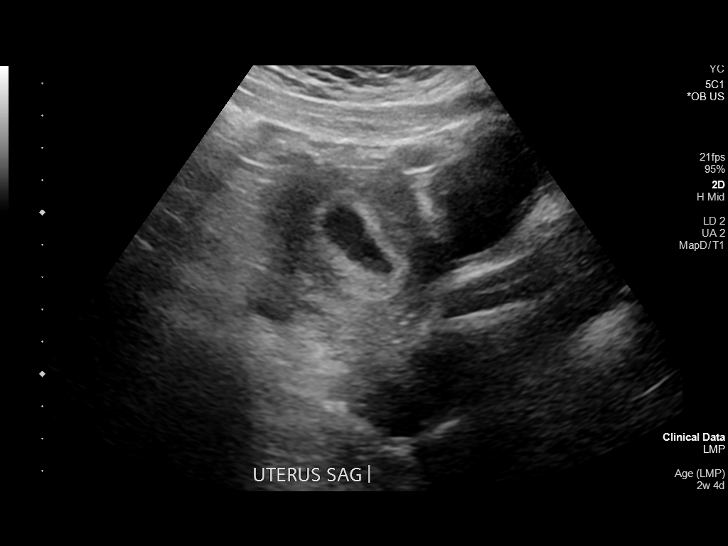
[im 10/87]
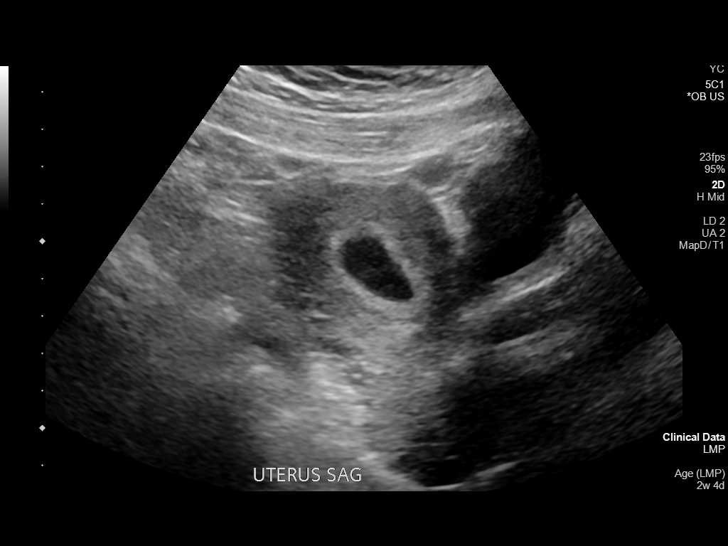
[im 16/87]
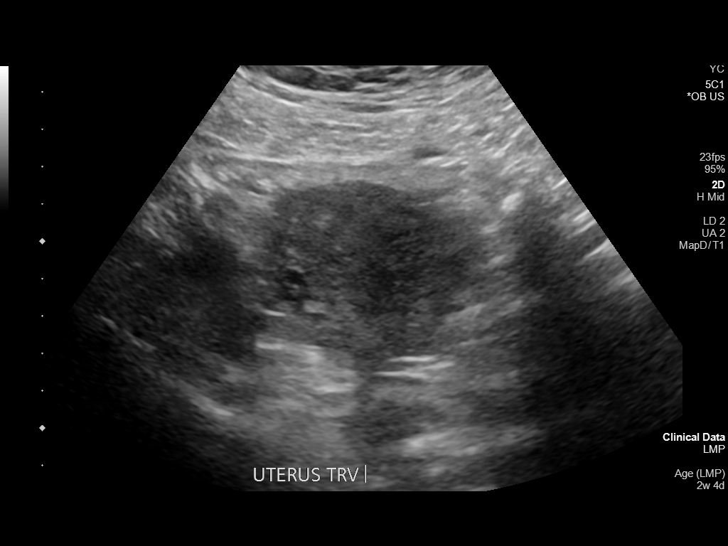
[im 23/87]
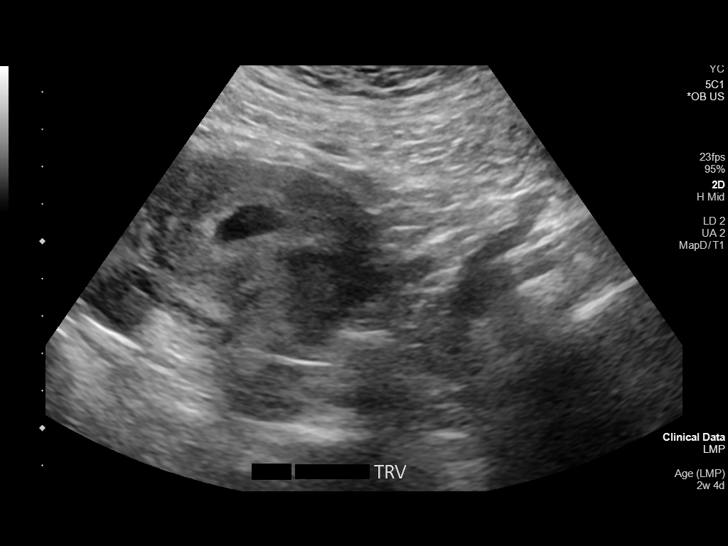
[im 29/87]
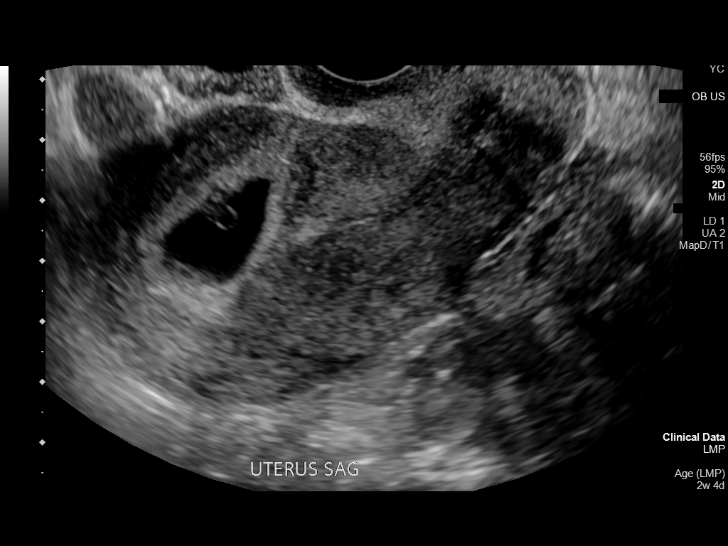
[im 36/87]
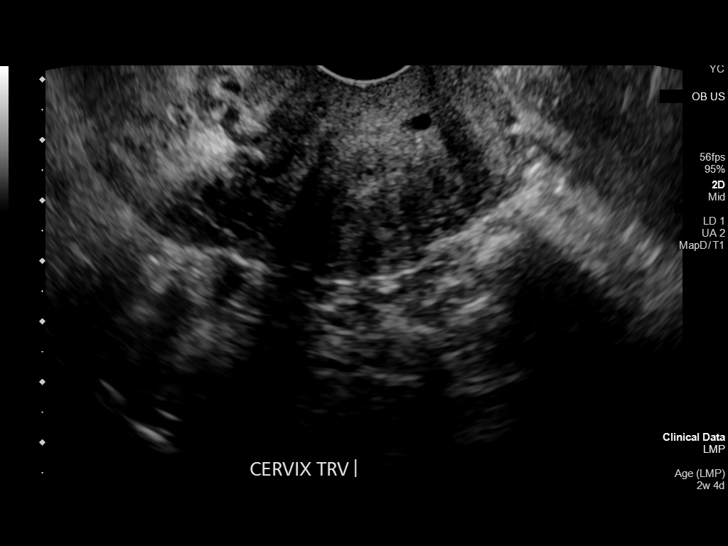
[im 45/87]
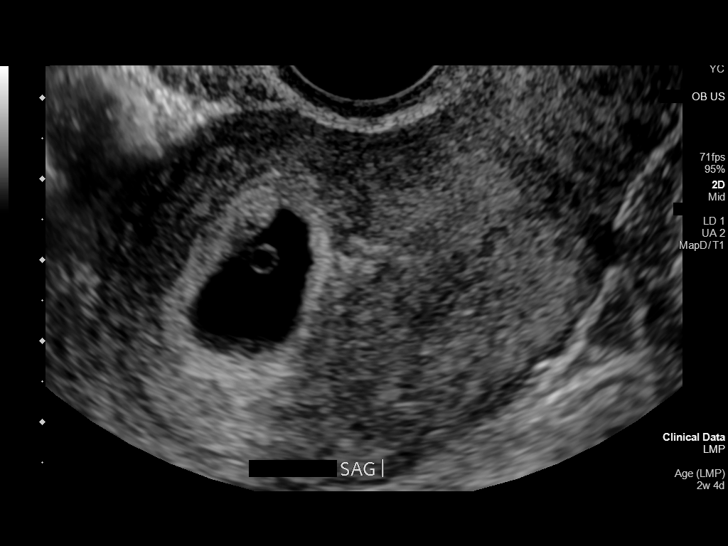
[im 51/87]
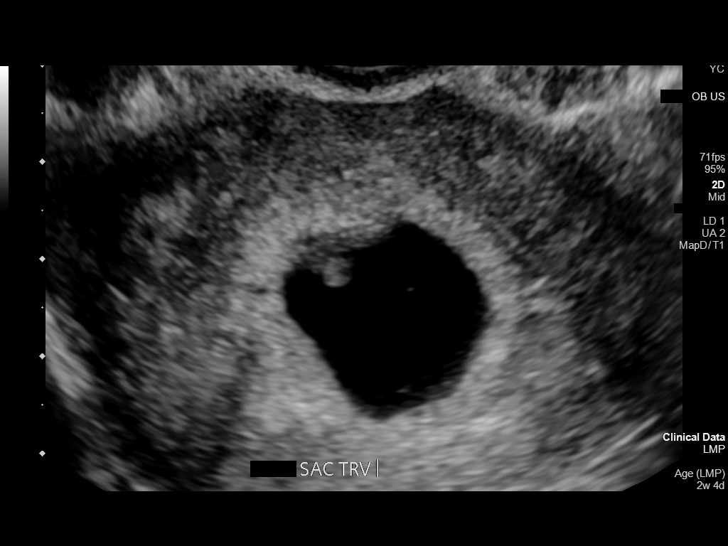
[im 58/87]
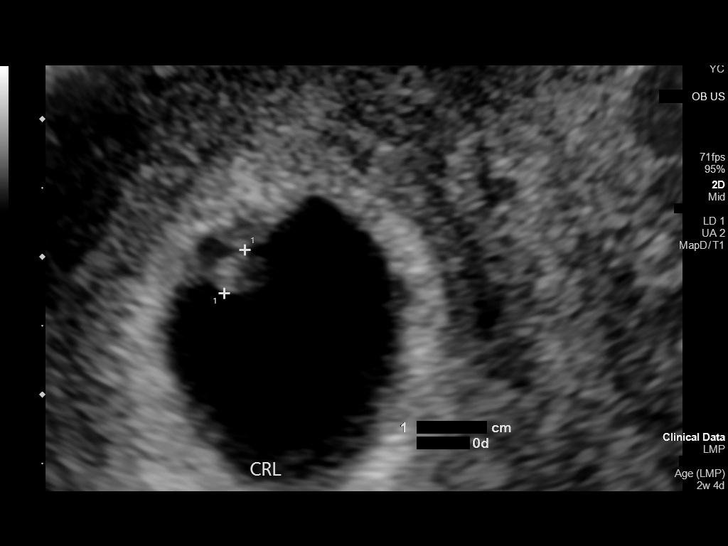
[im 64/87]
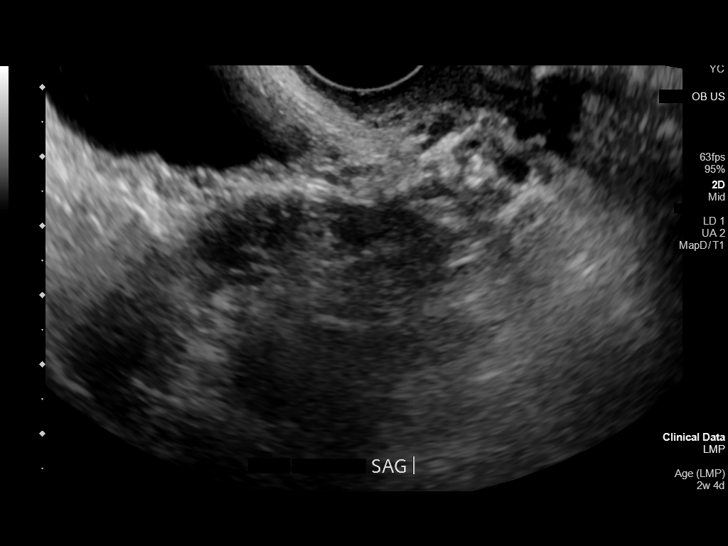
[im 71/87]
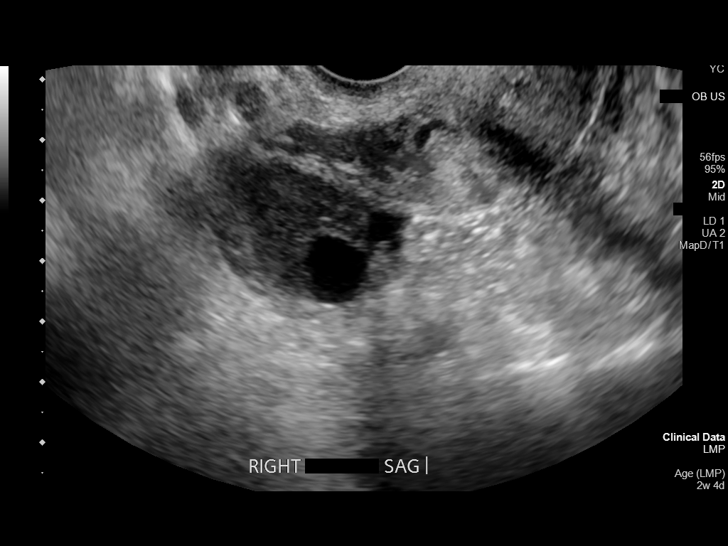
[im 77/87]
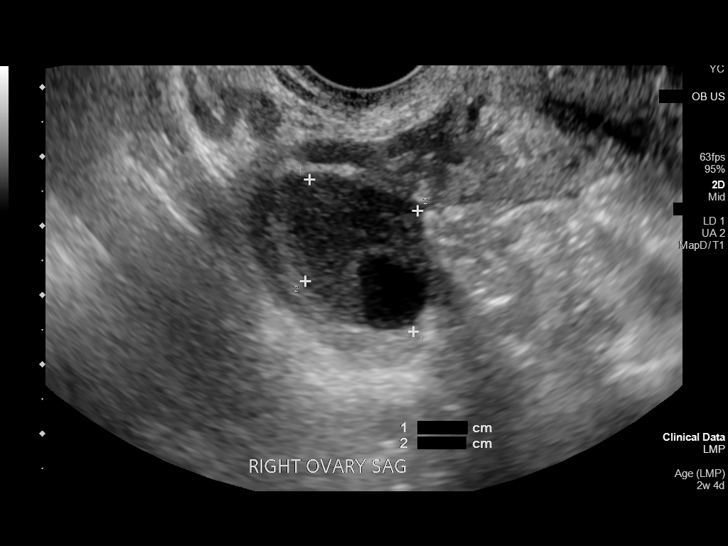
[im 83/87]
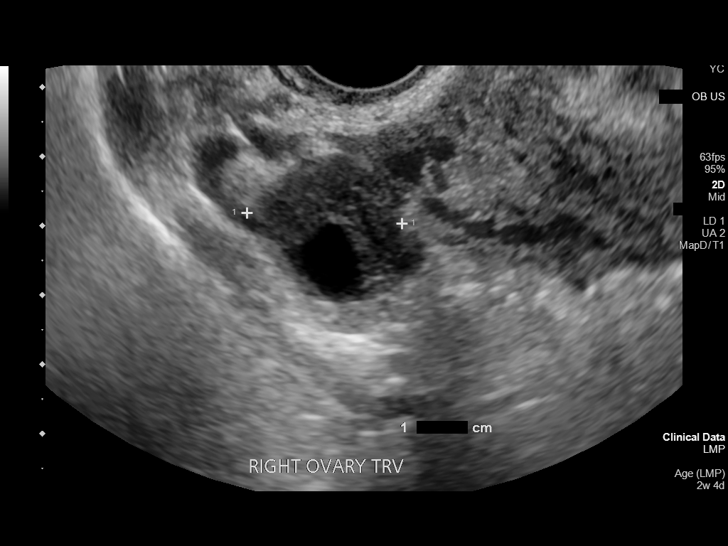

[13 of 28 positions shown; findings below may reference images not displayed]

FINDINGS: Intrauterine gestational sac: Single

Yolk sac:  Present

Embryo:  Present

Cardiac Activity: Cardiac activity is visualized on cine imaging,
although not able to be detected via M-mode due to small size.

Heart Rate: N/A  bpm

CRL: 3.5 mm   6 w   0 d                  US EDC: 03/14/2020

Subchorionic hemorrhage:  None visualized.

Maternal uterus/adnexae: Ovaries are normal in appearance
bilaterally. Small corpus luteal cyst noted on the right. No free
fluid within the pelvis.
IMPRESSION: 1. Single IUP with internal yolk sac and embryo, estimated
gestational age 6 weeks and 0 days by crown-rump length, with
ultrasound EDC of 03/14/2020. Cardiac activity visualized on
grayscale cine imaging, although not able to be measured due to
small size of the embryo. No complication.
2. No other acute maternal uterine or adnexal abnormality
identified.
